# Patient Record
Sex: Male | Born: 1995 | Race: White | Hispanic: No | Marital: Single | Smoking: Current every day smoker
Health system: Southern US, Community
[De-identification: ages and names within clinical notes are randomized; demographics above are authoritative.]

## PROBLEM LIST (undated history)

## (undated) DIAGNOSIS — F419 Anxiety disorder, unspecified: Secondary | ICD-10-CM

## (undated) DIAGNOSIS — F909 Attention-deficit hyperactivity disorder, unspecified type: Secondary | ICD-10-CM

## (undated) HISTORY — PX: TYMPANOSTOMY TUBE PLACEMENT: SHX32

## (undated) HISTORY — PX: HERNIA REPAIR: SHX51

## (undated) HISTORY — PX: ADENOIDECTOMY: SUR15

## (undated) HISTORY — PX: WISDOM TOOTH EXTRACTION: SHX21

## (undated) HISTORY — PX: HAND SURGERY: SHX662

---
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
## 2013-03-31 ENCOUNTER — Emergency Department (HOSPITAL_BASED_OUTPATIENT_CLINIC_OR_DEPARTMENT_OTHER): Payer: BC Managed Care – PPO

## 2013-03-31 ENCOUNTER — Encounter (HOSPITAL_BASED_OUTPATIENT_CLINIC_OR_DEPARTMENT_OTHER): Payer: Self-pay | Admitting: *Deleted

## 2013-03-31 ENCOUNTER — Emergency Department (HOSPITAL_BASED_OUTPATIENT_CLINIC_OR_DEPARTMENT_OTHER)
Admission: EM | Admit: 2013-03-31 | Discharge: 2013-03-31 | Disposition: A | Discharge status: Home / Self Care | Payer: BC Managed Care – PPO | Attending: Emergency Medicine | Admitting: Emergency Medicine

## 2013-03-31 DIAGNOSIS — R072 Precordial pain: Secondary | ICD-10-CM | POA: Insufficient documentation

## 2013-03-31 DIAGNOSIS — F17200 Nicotine dependence, unspecified, uncomplicated: Secondary | ICD-10-CM | POA: Insufficient documentation

## 2013-03-31 DIAGNOSIS — F909 Attention-deficit hyperactivity disorder, unspecified type: Secondary | ICD-10-CM | POA: Insufficient documentation

## 2013-03-31 DIAGNOSIS — F411 Generalized anxiety disorder: Secondary | ICD-10-CM | POA: Insufficient documentation

## 2013-03-31 DIAGNOSIS — R079 Chest pain, unspecified: Secondary | ICD-10-CM

## 2013-03-31 DIAGNOSIS — Z79899 Other long term (current) drug therapy: Secondary | ICD-10-CM | POA: Insufficient documentation

## 2013-03-31 HISTORY — DX: Anxiety disorder, unspecified: F41.9

## 2013-03-31 HISTORY — DX: Attention-deficit hyperactivity disorder, unspecified type: F90.9

## 2013-03-31 NOTE — ED Notes (Signed)
Pt c/o onset of sternal CP after smoking Hooka pen tonight. Pt denies any cough, no diaphoresis, no N/V.

## 2013-03-31 NOTE — ED Notes (Signed)
Patient transported to X-ray 
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
## 2013-12-16 IMAGING — CR DG CHEST 2V
2 series · 2 of 2 positions shown · non-contrast
Comparison: None.

CLINICAL DATA: Chest pain.

CHEST - 2 VIEW

[w chest pa]
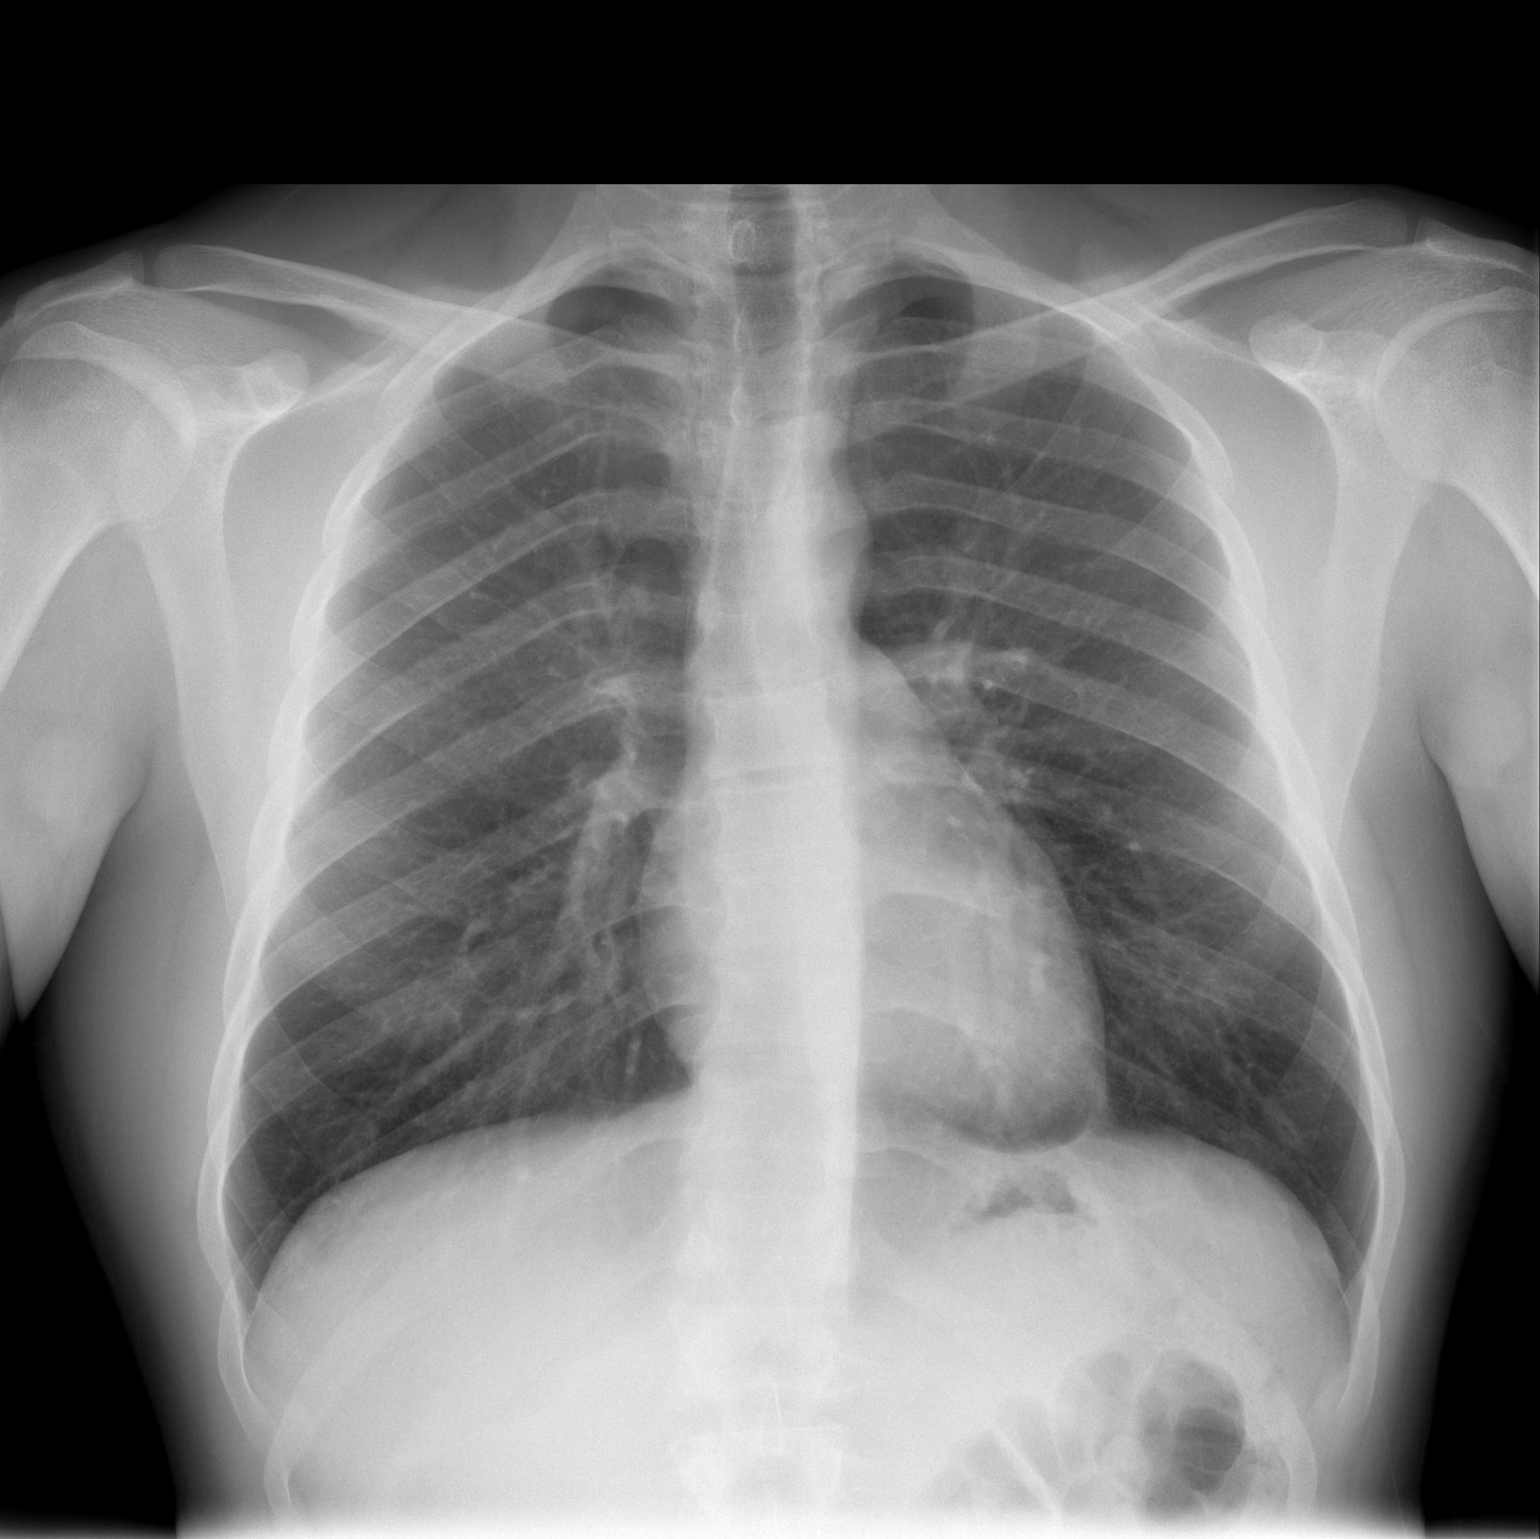

[w chest lat]
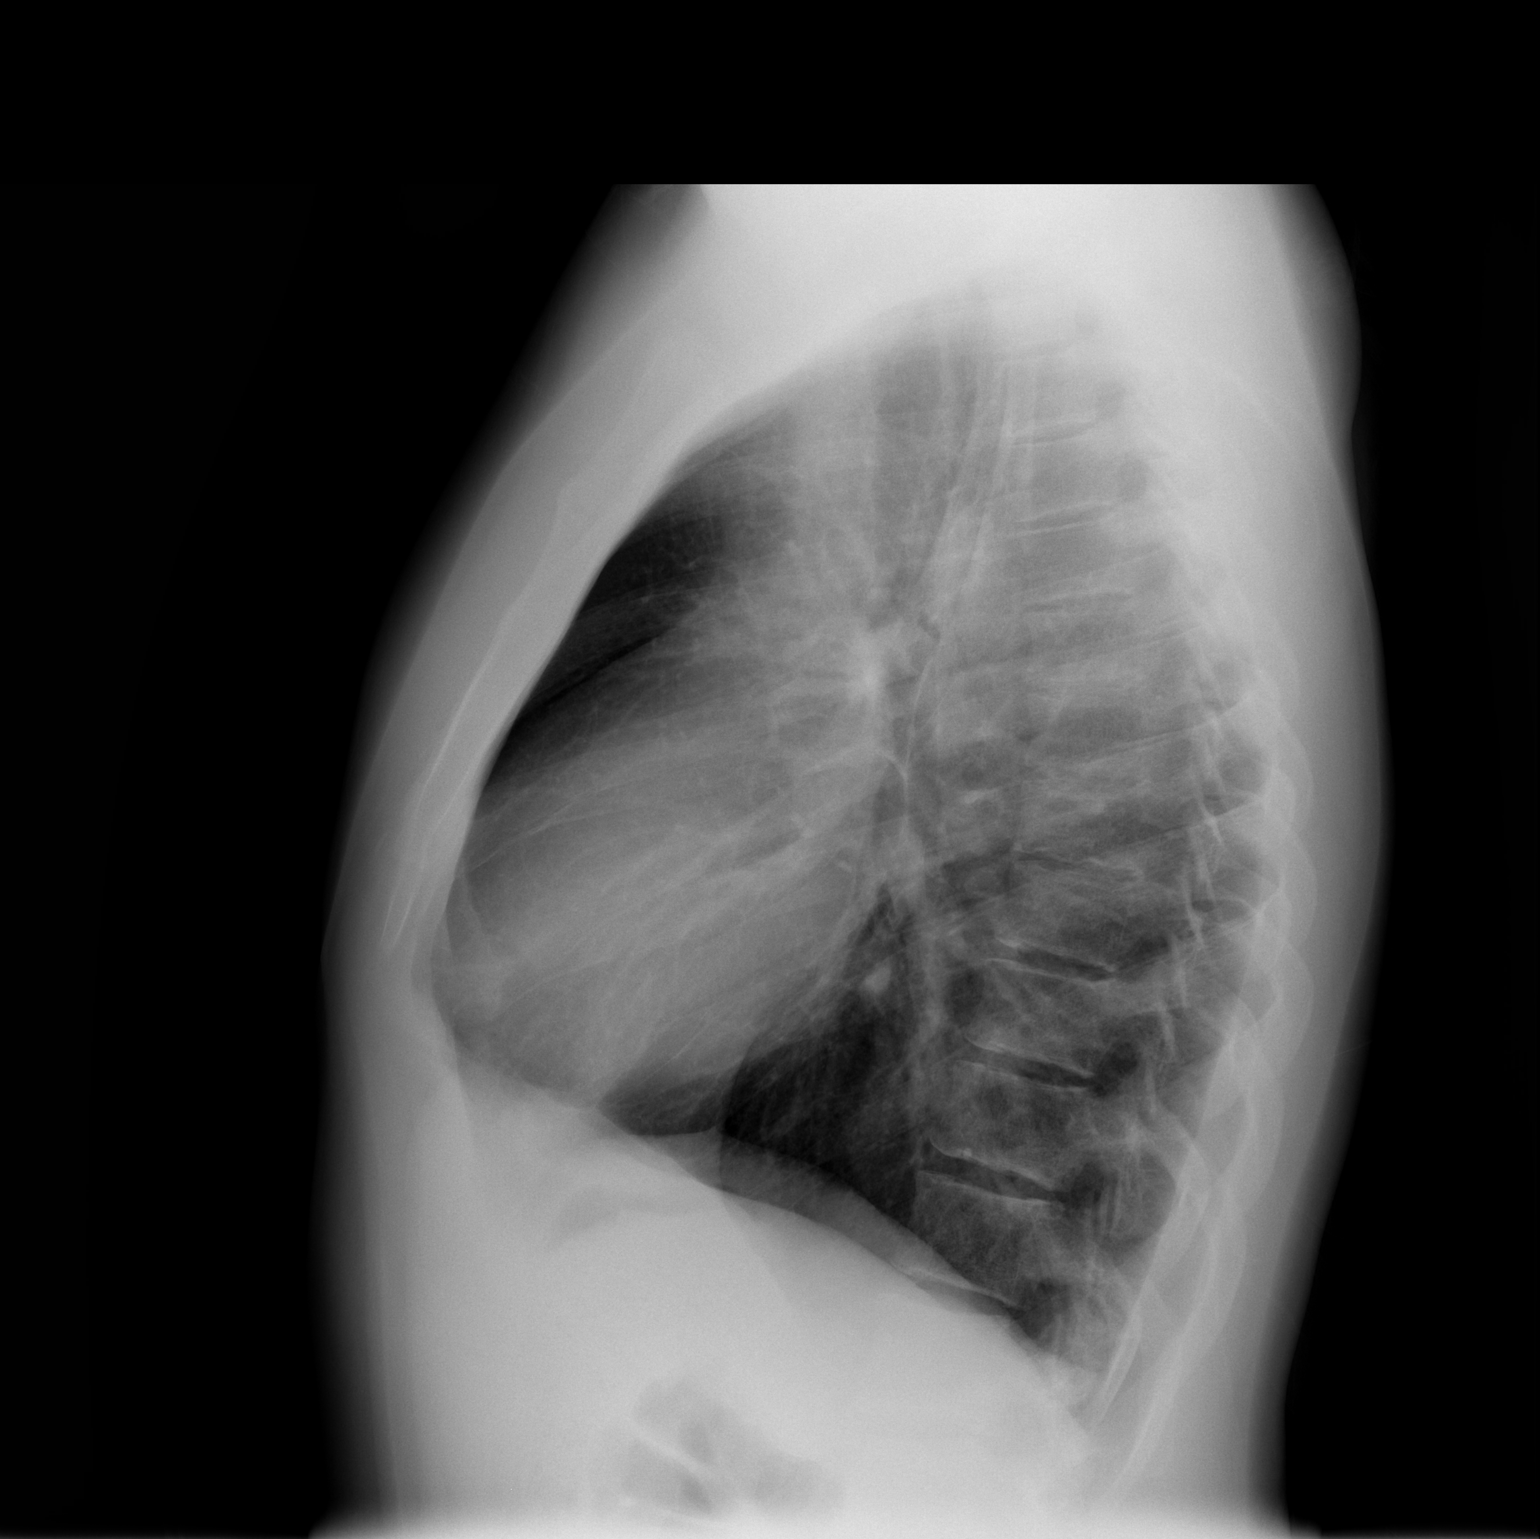

[2 of 2 positions shown; findings below may reference images not displayed]

FINDINGS: Cardiopericardial silhouette within normal limits.
Mediastinal contours normal. Trachea midline.  No airspace disease
or effusion.  Hyperinflation is present on both frontal and lateral
views.  No pneumomediastinum or pneumothorax.
IMPRESSION: Hyperinflation.  Question asthma.

## 2014-03-11 ENCOUNTER — Encounter (HOSPITAL_BASED_OUTPATIENT_CLINIC_OR_DEPARTMENT_OTHER): Payer: Self-pay | Admitting: Emergency Medicine

## 2014-03-11 ENCOUNTER — Emergency Department (HOSPITAL_BASED_OUTPATIENT_CLINIC_OR_DEPARTMENT_OTHER)
Admission: EM | Admit: 2014-03-11 | Discharge: 2014-03-11 | Disposition: A | Discharge status: Home / Self Care | Payer: No Typology Code available for payment source | Attending: Emergency Medicine | Admitting: Emergency Medicine

## 2014-03-11 ENCOUNTER — Emergency Department (HOSPITAL_BASED_OUTPATIENT_CLINIC_OR_DEPARTMENT_OTHER): Payer: No Typology Code available for payment source

## 2014-03-11 DIAGNOSIS — S43499A Other sprain of unspecified shoulder joint, initial encounter: Secondary | ICD-10-CM | POA: Insufficient documentation

## 2014-03-11 DIAGNOSIS — T148XXA Other injury of unspecified body region, initial encounter: Secondary | ICD-10-CM

## 2014-03-11 DIAGNOSIS — Y9389 Activity, other specified: Secondary | ICD-10-CM | POA: Insufficient documentation

## 2014-03-11 DIAGNOSIS — F17200 Nicotine dependence, unspecified, uncomplicated: Secondary | ICD-10-CM | POA: Insufficient documentation

## 2014-03-11 DIAGNOSIS — S46819A Strain of other muscles, fascia and tendons at shoulder and upper arm level, unspecified arm, initial encounter: Principal | ICD-10-CM

## 2014-03-11 DIAGNOSIS — Z79899 Other long term (current) drug therapy: Secondary | ICD-10-CM | POA: Insufficient documentation

## 2014-03-11 DIAGNOSIS — F909 Attention-deficit hyperactivity disorder, unspecified type: Secondary | ICD-10-CM | POA: Insufficient documentation

## 2014-03-11 DIAGNOSIS — S0990XA Unspecified injury of head, initial encounter: Secondary | ICD-10-CM | POA: Insufficient documentation

## 2014-03-11 DIAGNOSIS — F411 Generalized anxiety disorder: Secondary | ICD-10-CM | POA: Insufficient documentation

## 2014-03-11 DIAGNOSIS — Y92410 Unspecified street and highway as the place of occurrence of the external cause: Secondary | ICD-10-CM | POA: Insufficient documentation

## 2014-03-11 MED ORDER — METHOCARBAMOL 500 MG PO TABS
1000.0000 mg | ORAL_TABLET | Freq: Once | ORAL | Status: AC
  Administered 2014-03-11: 1000 mg via ORAL
  Filled 2014-03-11: qty 2

## 2014-03-11 MED ORDER — HYDROCODONE-ACETAMINOPHEN 5-325 MG PO TABS
1.0000 | ORAL_TABLET | Freq: Once | ORAL | Status: AC
  Administered 2014-03-11: 1 via ORAL
  Filled 2014-03-11: qty 1

## 2014-03-11 MED ORDER — IBUPROFEN 800 MG PO TABS: 800.0000 mg | ORAL_TABLET | Freq: Three times a day (TID) | ORAL | Status: AC

## 2014-03-11 MED ORDER — HYDROCODONE-ACETAMINOPHEN 5-325 MG PO TABS
1.0000 | ORAL_TABLET | Freq: Four times a day (QID) | ORAL | Status: AC | PRN
Start: 2014-03-11 — End: ?

## 2014-03-11 NOTE — Discharge Instructions (Signed)
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
## 2014-03-11 NOTE — ED Notes (Signed)
D/c home with ride and rx x 2 for hydrocodone and ibuprofen
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
## 2015-02-10 ENCOUNTER — Emergency Department (HOSPITAL_BASED_OUTPATIENT_CLINIC_OR_DEPARTMENT_OTHER)
Admission: EM | Admit: 2015-02-10 | Discharge: 2015-02-10 | Discharge status: Against Medical Advice | Payer: BLUE CROSS/BLUE SHIELD | Attending: Emergency Medicine | Admitting: Emergency Medicine

## 2015-02-10 ENCOUNTER — Encounter (HOSPITAL_BASED_OUTPATIENT_CLINIC_OR_DEPARTMENT_OTHER): Payer: Self-pay | Admitting: Emergency Medicine

## 2015-02-10 DIAGNOSIS — Z720 Tobacco use: Secondary | ICD-10-CM | POA: Insufficient documentation

## 2015-02-10 DIAGNOSIS — R040 Epistaxis: Secondary | ICD-10-CM | POA: Diagnosis not present

## 2015-02-10 NOTE — ED Notes (Signed)
Patient reports his nosebleed has stopped and he would like to leave and follow up with ENT this week.  Advised patient that he should stay for MSE.  Patient reports he wants to leave at this time.  Advised patient to come back to ED if symptoms worsen.  Patient voiced understanding.

## 2015-02-10 NOTE — ED Notes (Signed)
Pt reports Hx epitaxis and started have nosebleed around 9 pm denies event or injury

## 2017-05-31 ENCOUNTER — Emergency Department (HOSPITAL_BASED_OUTPATIENT_CLINIC_OR_DEPARTMENT_OTHER)
Admission: EM | Admit: 2017-05-31 | Discharge: 2017-05-31 | Disposition: A | Discharge status: Home / Self Care | Payer: 59 | Attending: Emergency Medicine | Admitting: Emergency Medicine

## 2017-05-31 ENCOUNTER — Encounter (HOSPITAL_BASED_OUTPATIENT_CLINIC_OR_DEPARTMENT_OTHER): Payer: Self-pay | Admitting: *Deleted

## 2017-05-31 DIAGNOSIS — Z5321 Procedure and treatment not carried out due to patient leaving prior to being seen by health care provider: Secondary | ICD-10-CM | POA: Diagnosis not present

## 2017-05-31 DIAGNOSIS — K625 Hemorrhage of anus and rectum: Secondary | ICD-10-CM | POA: Diagnosis present

## 2017-05-31 NOTE — ED Triage Notes (Signed)
Bright red blood in the past 2 bowel movements he has had since yesterday.

## 2017-05-31 NOTE — ED Notes (Signed)
Pt informed registration he was leaving.
